# Patient Record
Sex: Male | Born: 1960 | Race: White | Hispanic: No
Health system: Southern US, Community
[De-identification: ages and names within clinical notes are randomized; demographics above are authoritative.]

---

## 2019-01-08 ENCOUNTER — Encounter (HOSPITAL_COMMUNITY): Payer: Self-pay | Admitting: Emergency Medicine

## 2019-01-08 ENCOUNTER — Emergency Department (HOSPITAL_COMMUNITY): Payer: 59

## 2019-01-08 ENCOUNTER — Emergency Department (HOSPITAL_COMMUNITY)
Admission: EM | Admit: 2019-01-08 | Discharge: 2019-01-08 | Disposition: A | Discharge status: Home / Self Care | Payer: 59 | Attending: Emergency Medicine | Admitting: Emergency Medicine

## 2019-01-08 ENCOUNTER — Other Ambulatory Visit: Payer: Self-pay

## 2019-01-08 DIAGNOSIS — R112 Nausea with vomiting, unspecified: Secondary | ICD-10-CM

## 2019-01-08 DIAGNOSIS — R079 Chest pain, unspecified: Secondary | ICD-10-CM | POA: Diagnosis present

## 2019-01-08 DIAGNOSIS — R0789 Other chest pain: Secondary | ICD-10-CM

## 2019-01-08 DIAGNOSIS — Z20828 Contact with and (suspected) exposure to other viral communicable diseases: Secondary | ICD-10-CM | POA: Insufficient documentation

## 2019-01-08 DIAGNOSIS — R200 Anesthesia of skin: Secondary | ICD-10-CM

## 2019-01-08 LAB — BASIC METABOLIC PANEL
Anion gap: 14 (ref 5–15)
BUN: 18 mg/dL (ref 6–20)
CO2: 24 mmol/L (ref 22–32)
Calcium: 9.4 mg/dL (ref 8.9–10.3)
Chloride: 99 mmol/L (ref 98–111)
Creatinine, Ser: 1.12 mg/dL (ref 0.61–1.24)
GFR calc Af Amer: >60 mL/min (ref >60)
GFR calc non Af Amer: >60 mL/min (ref >60)
Glucose, Bld: 124 mg/dL — ABNORMAL HIGH (ref 70–99)
Potassium: 3.2 mmol/L — ABNORMAL LOW (ref 3.5–5.1)
Sodium: 137 mmol/L (ref 135–145)

## 2019-01-08 LAB — CBC WITH DIFFERENTIAL/PLATELET
Abs Immature Granulocytes: 0.04 10*3/uL (ref 0.00–0.07)
Basophils Absolute: 0.1 10*3/uL (ref 0.0–0.1)
Basophils Relative: 1 %
Eosinophils Absolute: 0.1 10*3/uL (ref 0.0–0.5)
Eosinophils Relative: 1 %
HCT: 50.1 % (ref 39.0–52.0)
Hemoglobin: 17.3 g/dL — ABNORMAL HIGH (ref 13.0–17.0)
Immature Granulocytes: 0 %
Lymphocytes Relative: 17 %
Lymphs Abs: 1.8 10*3/uL (ref 0.7–4.0)
MCH: 32 pg (ref 26.0–34.0)
MCHC: 34.5 g/dL (ref 30.0–36.0)
MCV: 92.8 fL (ref 80.0–100.0)
Monocytes Absolute: 0.9 10*3/uL (ref 0.1–1.0)
Monocytes Relative: 8 %
Neutro Abs: 7.8 10*3/uL — ABNORMAL HIGH (ref 1.7–7.7)
Neutrophils Relative %: 73 %
Platelets: 196 10*3/uL (ref 150–400)
RBC: 5.4 MIL/uL (ref 4.22–5.81)
RDW: 13.6 % (ref 11.5–15.5)
WBC: 10.6 10*3/uL — ABNORMAL HIGH (ref 4.0–10.5)
nRBC: 0 % (ref 0.0–0.2)

## 2019-01-08 LAB — POCT I-STAT TROPONIN I: Troponin i, poc: 0 ng/mL (ref 0.00–0.08)

## 2019-01-08 LAB — TROPONIN I: Troponin I: <0.03 ng/mL (ref <0.03)

## 2019-01-08 LAB — D-DIMER, QUANTITATIVE: D-Dimer, Quant: 2.07 ug/mL-FEU — ABNORMAL HIGH (ref 0.00–0.50)

## 2019-01-08 LAB — ETHANOL: Alcohol, Ethyl (B): <10 mg/dL (ref <10)

## 2019-01-08 LAB — SARS CORONAVIRUS 2 BY RT PCR (HOSPITAL ORDER, PERFORMED IN ~~LOC~~ HOSPITAL LAB): SARS Coronavirus 2: NEGATIVE

## 2019-01-08 LAB — GLUCOSE, CAPILLARY: Glucose-Capillary: 141 mg/dL — ABNORMAL HIGH (ref 70–99)

## 2019-01-08 MED ORDER — SODIUM CHLORIDE (PF) 0.9 % IJ SOLN
INTRAMUSCULAR | Status: AC
  Filled 2019-01-08: qty 50

## 2019-01-08 MED ORDER — ONDANSETRON HCL 4 MG PO TABS: 4.0000 mg | ORAL_TABLET | Freq: Four times a day (QID) | ORAL | 0 refills | Status: AC

## 2019-01-08 MED ORDER — PANTOPRAZOLE SODIUM 40 MG IV SOLR
40.0000 mg | Freq: Once | INTRAVENOUS | Status: AC
  Administered 2019-01-08: 40 mg via INTRAVENOUS
  Filled 2019-01-08: qty 40

## 2019-01-08 MED ORDER — GLUCAGON HCL RDNA (DIAGNOSTIC) 1 MG IJ SOLR
1.0000 mg | Freq: Once | INTRAMUSCULAR | Status: AC
  Administered 2019-01-08: 1 mg via INTRAVENOUS
  Filled 2019-01-08: qty 1

## 2019-01-08 MED ORDER — IOHEXOL 350 MG/ML SOLN
100.0000 mL | Freq: Once | INTRAVENOUS | Status: AC | PRN
  Administered 2019-01-08: 100 mL via INTRAVENOUS

## 2019-01-08 MED ORDER — PROMETHAZINE HCL 25 MG/ML IJ SOLN
12.5000 mg | Freq: Once | INTRAMUSCULAR | Status: AC
  Administered 2019-01-08: 12.5 mg via INTRAVENOUS
  Filled 2019-01-08: qty 1

## 2019-01-08 MED ORDER — KETOROLAC TROMETHAMINE 15 MG/ML IJ SOLN
15.0000 mg | Freq: Once | INTRAMUSCULAR | Status: AC
  Administered 2019-01-08: 15 mg via INTRAVENOUS
  Filled 2019-01-08: qty 1

## 2019-01-08 MED ORDER — FAMOTIDINE 20 MG PO TABS: 20.0000 mg | ORAL_TABLET | Freq: Two times a day (BID) | ORAL | 0 refills | Status: AC

## 2019-01-08 MED ORDER — SODIUM CHLORIDE 0.9% FLUSH
3.0000 mL | Freq: Once | INTRAVENOUS | Status: AC
  Administered 2019-01-08: 3 mL via INTRAVENOUS

## 2019-01-08 NOTE — ED Notes (Signed)
BG=[id number]

## 2019-01-08 NOTE — ED Triage Notes (Signed)
Pt arriving POV with chest pain, N/V, and dizziness. Pt reports the pain started 3 days ago and is most prevalent in the mid-sternal area. Pt seen in [place] yesterday for same.

## 2019-01-08 NOTE — ED Provider Notes (Signed)
[facility] DEPT Provider Note: Lowella Dell, MD, FACEP  CSN: [id number] MRN: [id number] ARRIVAL: 01/08/19 at 0435 ROOM: WA16/[facility]16   CHIEF COMPLAINT  Chest Pain   HISTORY OF PRESENT ILLNESS  01/08/19 5:14 AM William Yates is a 58 y.o. male without significant past medical history.  He is here with a 3-day history of nausea, vomiting and chest pain.  He states these all began about the same time.  He describes the chest pain as sharp, located in his lower sternal area, and rates it as an 8 out of 10.  It is worse with palpation or movement.  He states he has not been able to swallow anything for the past 3 days because it feels like it gets stuck and then comes back up.  He also feels short of breath.  He has had a subjective fever and chills.  He was seen yesterday at [facility] in [place] and was tested for strep throat (because he has had a scratchy throat) which was negative, and a respiratory viral panel which was negative.  This respiratory viral panel included common coronavirus is but did not include the COVID-19 pathogen.  He was diagnosed with a viral illness and discharged.  He states that for several hours now his forehead has become numb bilaterally and his left arm has become numb.  He denies motor weakness.  He states that in triage he hallucinated about an elevator which was not there.  He denies eating steak or anything else that may have gotten stuck in his esophagus and does not believe this is possible because he was vomiting freely at the beginning of his illness.   History reviewed. No pertinent past medical history.  History reviewed. No pertinent surgical history.  No family history on file.  Social History   Tobacco Use  . Smoking status: Not on file  Substance Use Topics  . Alcohol use: Not on file  . Drug use: Not on file    Prior to Admission medications   Not on File    Allergies Patient has no known allergies.   REVIEW OF SYSTEMS   Negative except as noted here or in the History of Present Illness.   PHYSICAL EXAMINATION  Initial Vital Signs Blood pressure (!) 142/109, pulse 99, temperature 98.5 F (36.9 C), temperature source Oral, resp. rate 16, height 6\' 2"  (1.88 m), weight 99.8 kg, SpO2 100 %.  Examination General: Well-developed, well-nourished male in no acute distress; appearance consistent with age of record HENT: normocephalic; atraumatic Eyes: pupils equal, round and reactive to light; extraocular muscles intact Neck: supple Heart: regular rate and rhythm Lungs: clear to auscultation bilaterally Chest: Lower sternal tenderness which reproduces the pain of the chief complaint Abdomen: soft; nondistended; nontender; bowel sounds present Extremities: No deformity; full range of motion; pulses normal Neurologic: Awake, alert and oriented; motor function intact in all extremities and symmetric; no facial droop; decreased sensation of forehead bilaterally and left upper extremity; negative Romberg; normal coordination and speech Skin: Warm and dry Psychiatric: Normal mood and affect   RESULTS  Summary of this visit's results, reviewed by myself:   EKG Interpretation  Date/Time:  Wednesday Jan 08 2019 04:51:39 EDT Ventricular Rate:  117 PR Interval:    QRS Duration: 78 QT Interval:  366 QTC Calculation: 511 R Axis:   -19 Text Interpretation:  Sinus tachycardia Borderline left axis deviation Prolonged QT interval No previous ECGs available Confirmed by Stevi Hollinshead ([id number]) on 01/08/2019 4:56:11 AM Also confirmed by  Trenten Watchman ([id number]), editor Barbette Hair [phone])  on 01/08/2019 6:57:21 AM      Laboratory Studies: Results for orders placed or performed during the hospital encounter of 01/08/19 (from the past 24 hour(s))  Basic metabolic panel     Status: Abnormal   Collection Time: 01/08/19  5:01 AM  Result Value Ref Range   Sodium 137 135 - 145 mmol/L   Potassium 3.2 (L) 3.5 - 5.1 mmol/L    Chloride 99 98 - 111 mmol/L   CO2 24 22 - 32 mmol/L   Glucose, Bld 124 (H) 70 - 99 mg/dL   BUN 18 6 - 20 mg/dL   Creatinine, Ser 0.98 0.61 - 1.24 mg/dL   Calcium 9.4 8.9 - 11.9 mg/dL   GFR calc non Af Amer >60 >60 mL/min   GFR calc Af Amer >60 >60 mL/min   Anion gap 14 5 - 15  Troponin I - ONCE - STAT     Status: None   Collection Time: 01/08/19  5:01 AM  Result Value Ref Range   Troponin I <0.03 <0.03 ng/mL  CBC with Differential/Platelet     Status: Abnormal   Collection Time: 01/08/19  5:08 AM  Result Value Ref Range   WBC 10.6 (H) 4.0 - 10.5 K/uL   RBC 5.40 4.22 - 5.81 MIL/uL   Hemoglobin 17.3 (H) 13.0 - 17.0 g/dL   HCT 14.7 82.9 - 56.2 %   MCV 92.8 80.0 - 100.0 fL   MCH 32.0 26.0 - 34.0 pg   MCHC 34.5 30.0 - 36.0 g/dL   RDW 13.0 86.5 - 78.4 %   Platelets 196 150 - 400 K/uL   nRBC 0.0 0.0 - 0.2 %   Neutrophils Relative % 73 %   Neutro Abs 7.8 (H) 1.7 - 7.7 K/uL   Lymphocytes Relative 17 %   Lymphs Abs 1.8 0.7 - 4.0 K/uL   Monocytes Relative 8 %   Monocytes Absolute 0.9 0.1 - 1.0 K/uL   Eosinophils Relative 1 %   Eosinophils Absolute 0.1 0.0 - 0.5 K/uL   Basophils Relative 1 %   Basophils Absolute 0.1 0.0 - 0.1 K/uL   Immature Granulocytes 0 %   Abs Immature Granulocytes 0.04 0.00 - 0.07 K/uL  D-dimer, quantitative (not at [facility])     Status: Abnormal   Collection Time: 01/08/19  5:08 AM  Result Value Ref Range   D-Dimer, Quant 2.07 (H) 0.00 - 0.50 ug/mL-FEU  Ethanol     Status: None   Collection Time: 01/08/19  5:08 AM  Result Value Ref Range   Alcohol, Ethyl (B) <10 <10 mg/dL  Glucose, capillary     Status: Abnormal   Collection Time: 01/08/19  5:25 AM  Result Value Ref Range   Glucose-Capillary 141 (H) 70 - 99 mg/dL   Imaging Studies: Dg Chest 2 View  Result Date: 01/08/2019 CLINICAL DATA:  Chest pain for 24 hours EXAM: CHEST - 2 VIEW COMPARISON:  None. FINDINGS: The heart size and mediastinal contours are within normal limits. Both lungs are clear. The  visualized skeletal structures are unremarkable. IMPRESSION: Negative chest. Electronically Signed   By: Marnee Spring M.D.   On: 01/08/2019 05:35    ED COURSE and MDM  Nursing notes and initial vitals signs, including pulse oximetry, reviewed.  Vitals:   01/08/19 0535 01/08/19 0540 01/08/19 0550 01/08/19 0600  BP: (!) 159/114 (!) 148/112 (!) 151/119 (!) 140/108  Pulse: (!) 103 96 98 93  Resp: (!) 22 17  20 14  Temp:      TempSrc:      SpO2: 99% 93% 97% 95%  Weight:      Height:       7:08 AM Awaiting results of CT scan of head and chest.  Dr. Rodena MedinMessick to follow-up on results and make disposition.  PROCEDURES    ED DIAGNOSES     ICD-10-CM   1. Atypical chest pain R07.89   2. Nausea and vomiting in adult R11.2   3. Numbness R20.0        Charlita Brian, Jonny RuizJohn, MD 01/08/19 [phone]

## 2019-01-08 NOTE — Discharge Instructions (Signed)
Please return for any problem. Follow up with a regular provider as instructed.   Your COVID test was negative today.   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 This does not require emergent treatment now. This should be re-imaged on annual basis for monitoring. A regular care provider can manage this.

## 2019-01-08 NOTE — ED Notes (Addendum)
Pt said, has chest pain and vomiting for 3 days. Tingling down left arm. Hallucinated in lobby that he was in elevator and sitting in chair. Didn't feel tapping on left side of face. Head is numb. Feels neck.

## 2019-01-08 NOTE — ED Notes (Signed)
ED Provider at bedside. 

## 2019-01-08 NOTE — ED Provider Notes (Signed)
Patient seen after sign out from prior ED provider.   Patient without evidence of significant acute pathology.  EKG does not demonstrate acute ischemia. Trop times 2 is negative. Reported symptoms are not consistent with ACS.   CTA did not demonstrate PE or other acute pathology. Patient is aware of aorta dilation found and CT and need for close follow up.   Patient does feel improved and now desires DC.   Strict return precautions given and understood.   Importance of close follow up is repeatedly stressed.    Wynetta Fines, MD 01/08/19 1000

## 2019-09-22 DEATH — deceased

## 2021-01-12 IMAGING — CR CHEST - 2 VIEW
2 series · 2 of 2 positions shown · non-contrast
Comparison: None.

CLINICAL DATA: Chest pain for 24 hours

EXAM:
CHEST - 2 VIEW

[w chest pa]
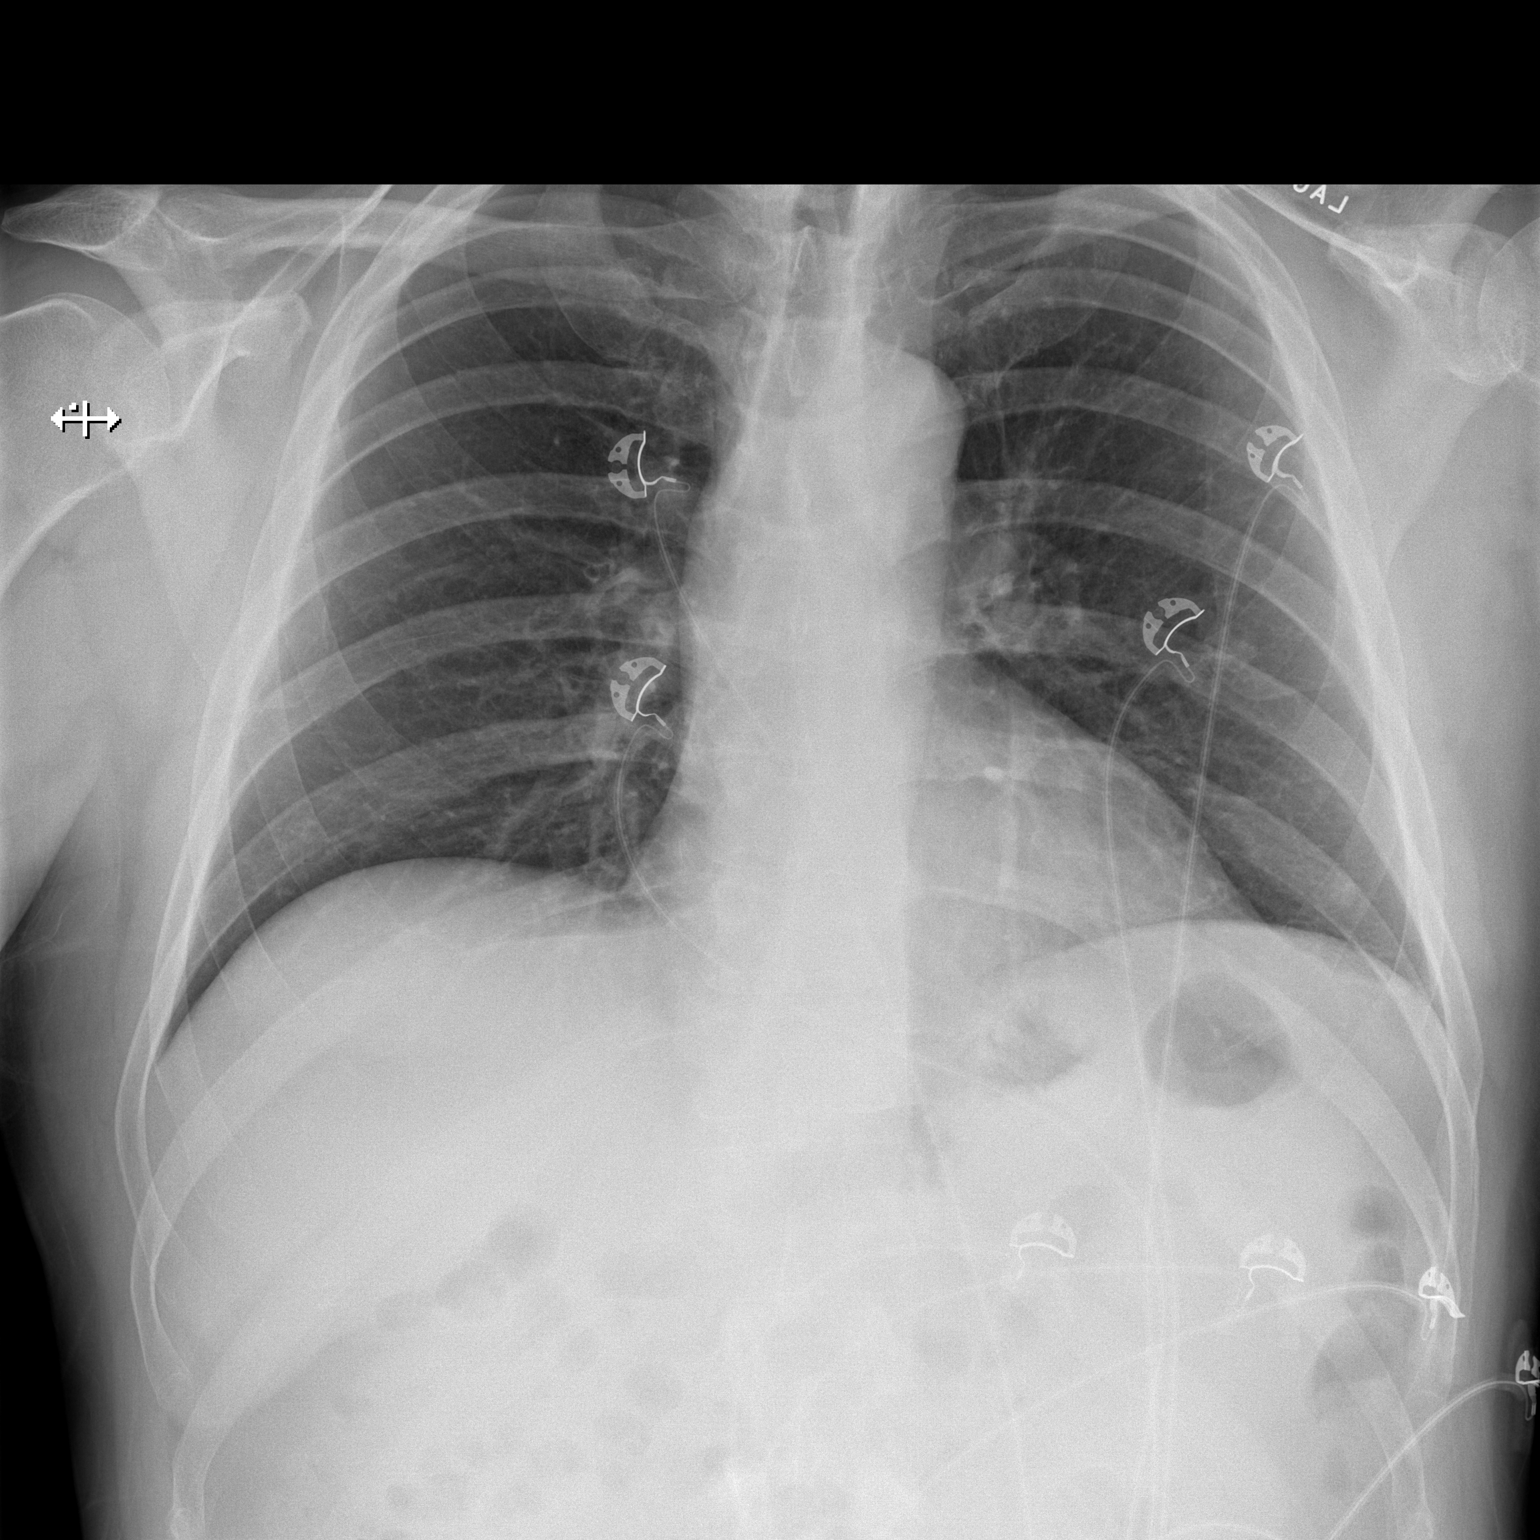

[w chest lat]
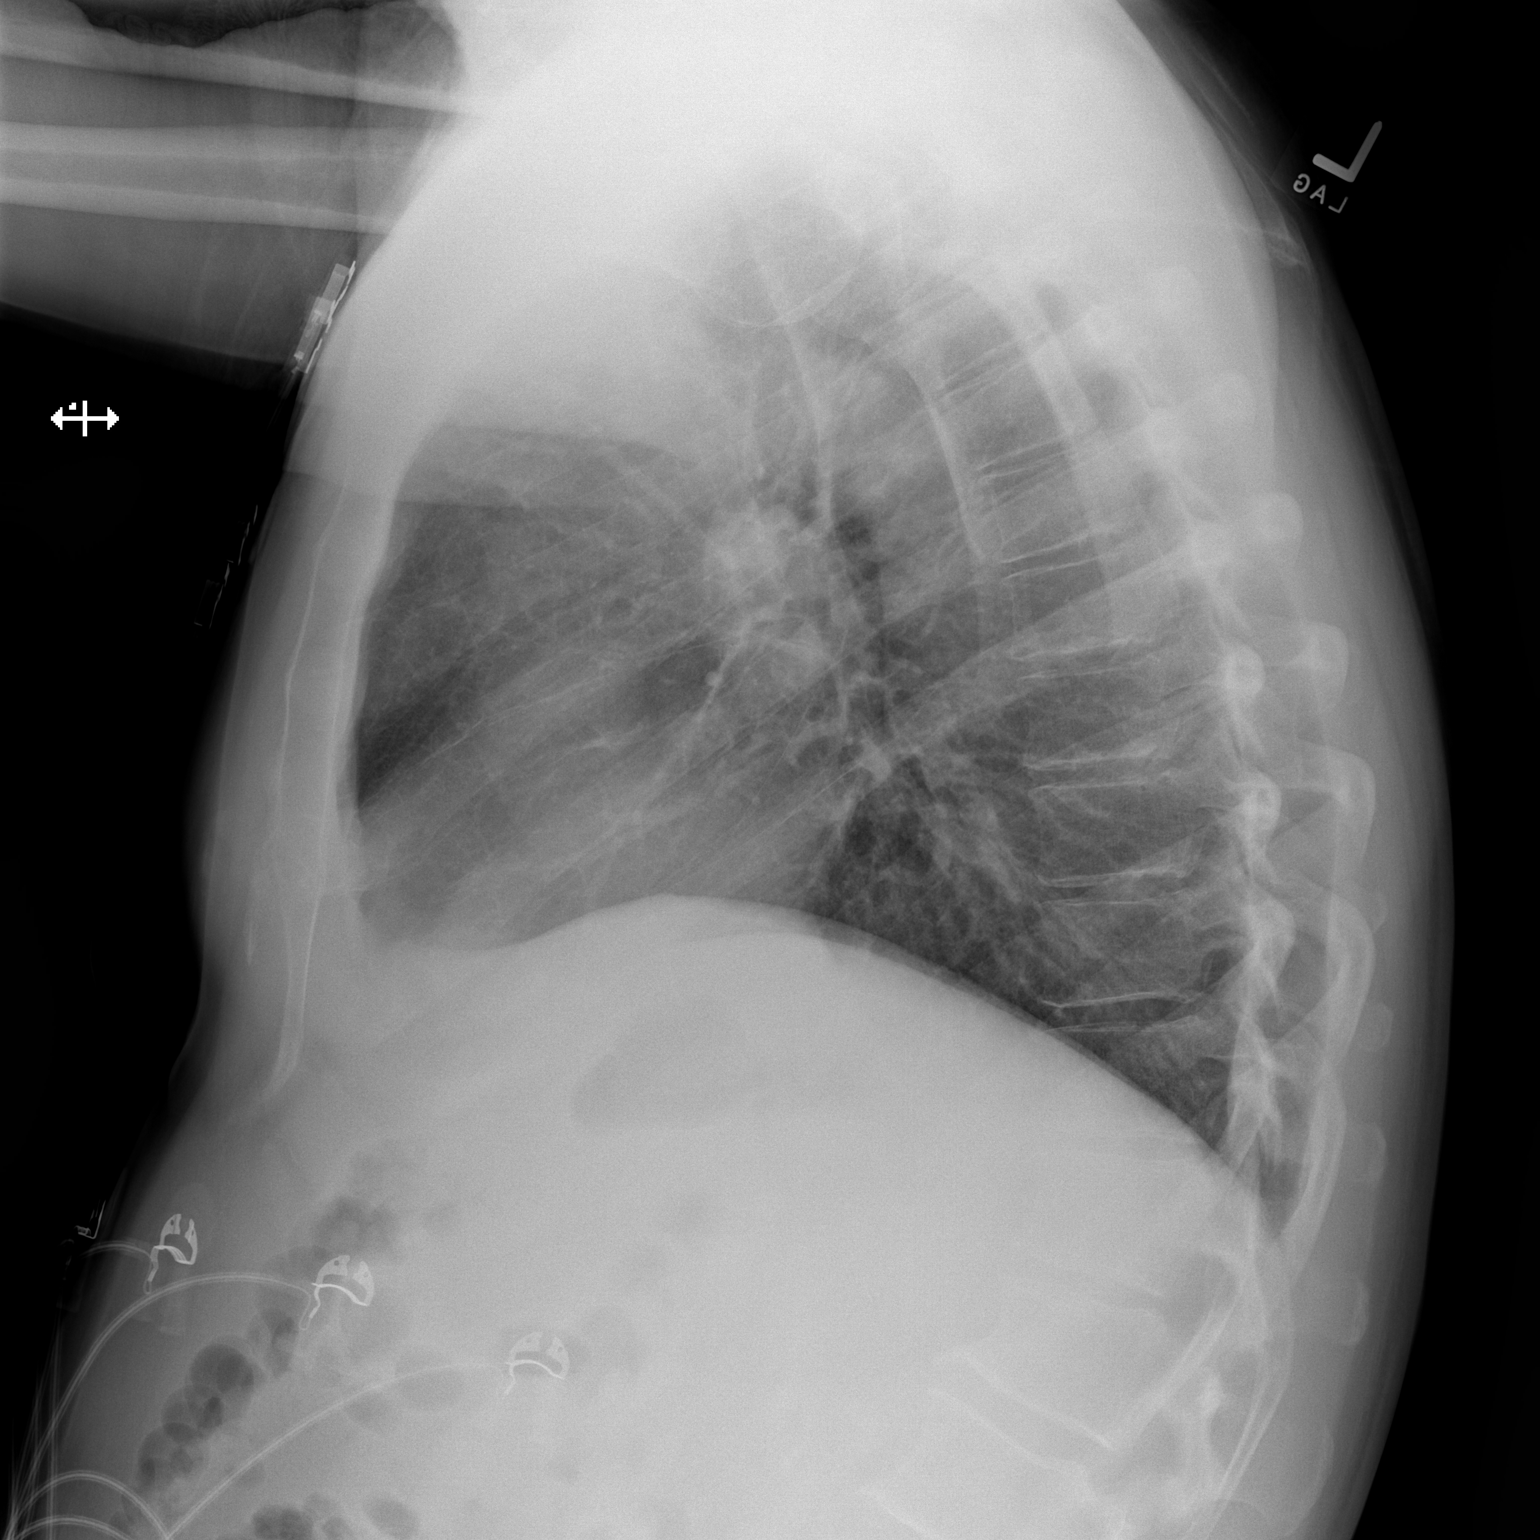

[2 of 2 positions shown; findings below may reference images not displayed]

FINDINGS: The heart size and mediastinal contours are within normal limits.
Both lungs are clear. The visualized skeletal structures are
unremarkable.
IMPRESSION: Negative chest.

## 2021-01-12 IMAGING — CT CT HEAD WITHOUT CONTRAST
3 series · 16 of 47 positions shown, 19 images · non-contrast
Comparison: None.

CLINICAL DATA: Numbness to LEFT head and face. Tingling down LEFT
arm. Hallucinations. Elevated blood sugar.

EXAM:
CT HEAD WITHOUT CONTRAST
TECHNIQUE: Contiguous axial images were obtained from the base of the skull
through the vertex without intravenous contrast.

[Series 2: head wo · axial · 0.47mm/px · z∈[-96,+39]mm · 10 of 33 slices shown, 13 images]
[im 3/33  brain]
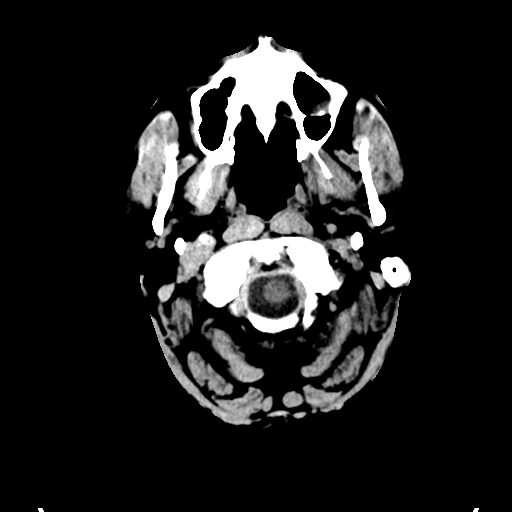
[im 3/33  bone]
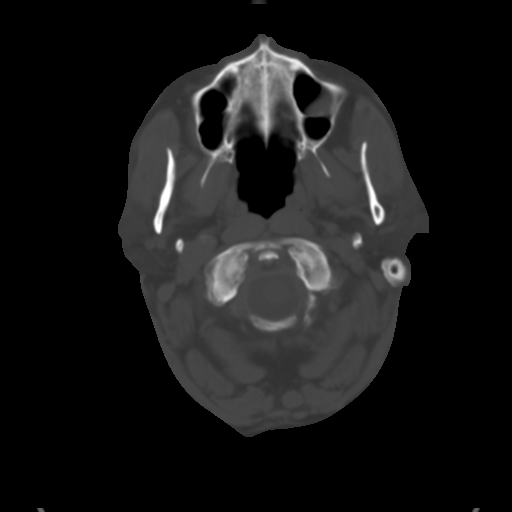
[im 6/33  brain]
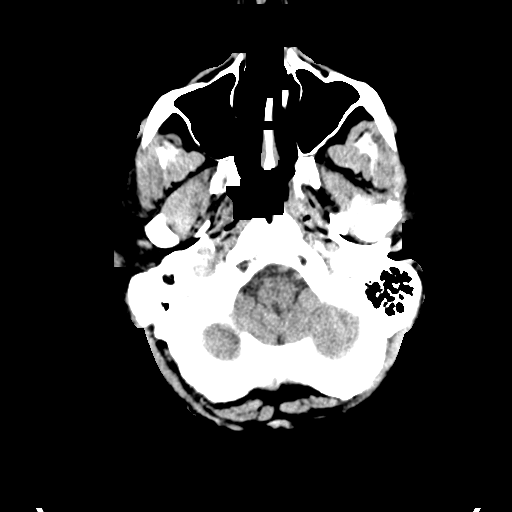
[im 9/33  brain]
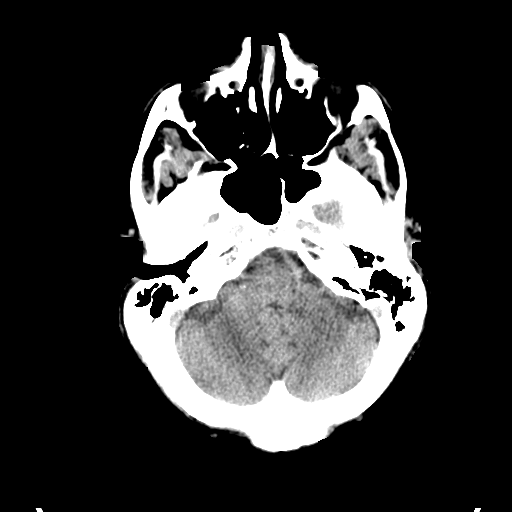
[im 12/33  brain]
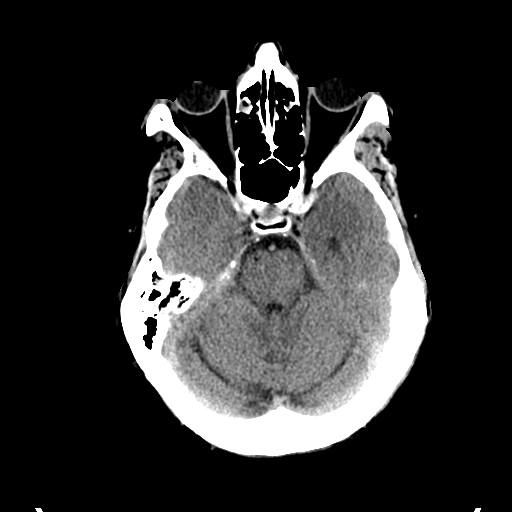
[im 15/33  brain]
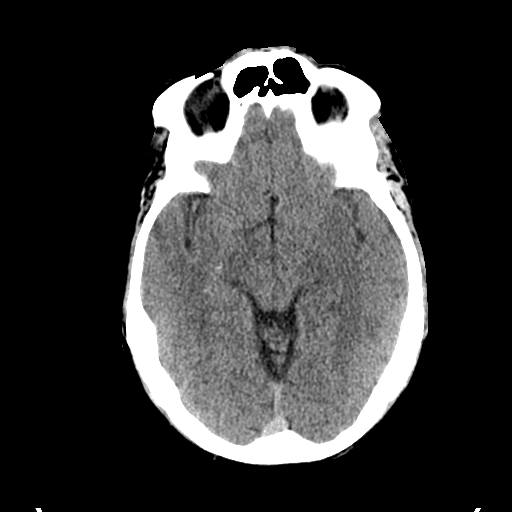
[im 15/33  bone]
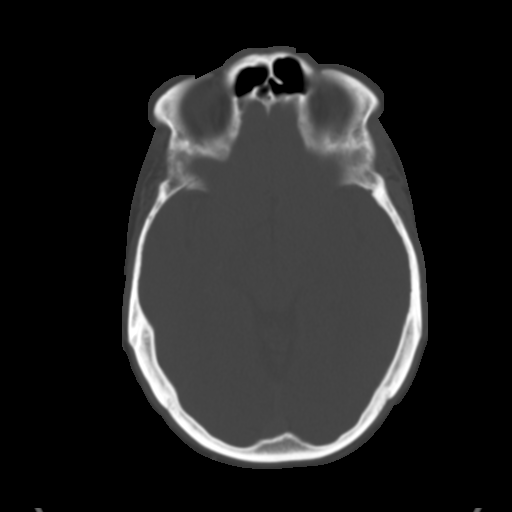
[im 18/33  brain]
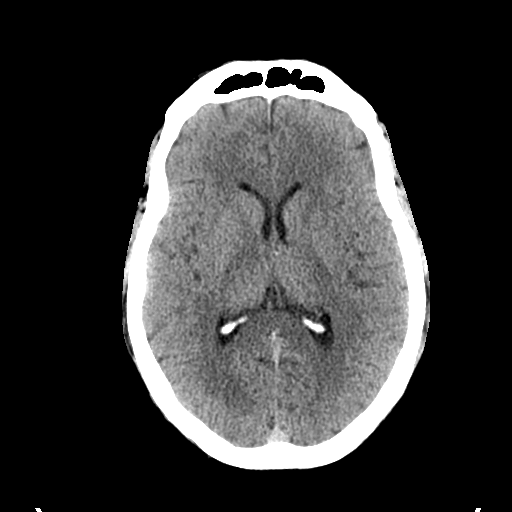
[im 21/33  brain]
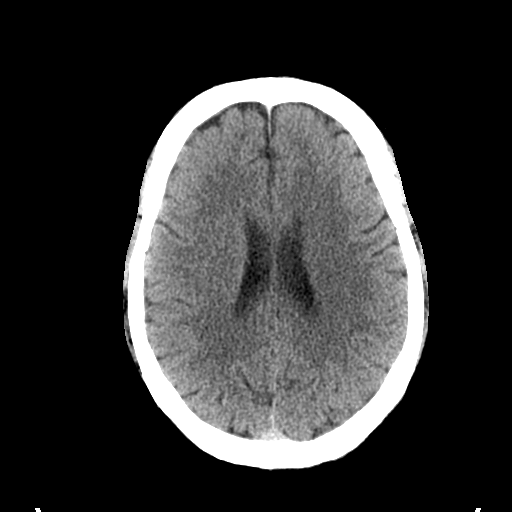
[im 25/33  brain]
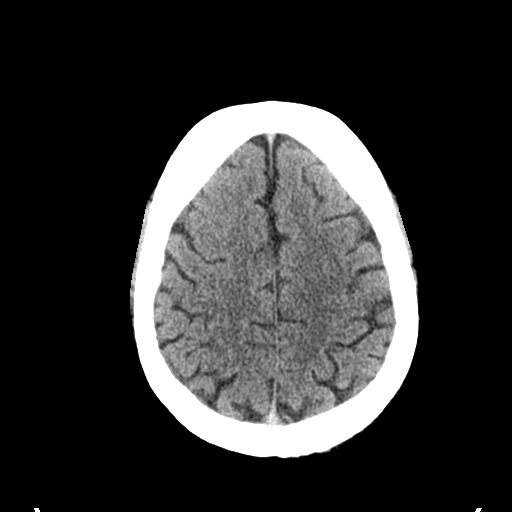
[im 27/33  brain]
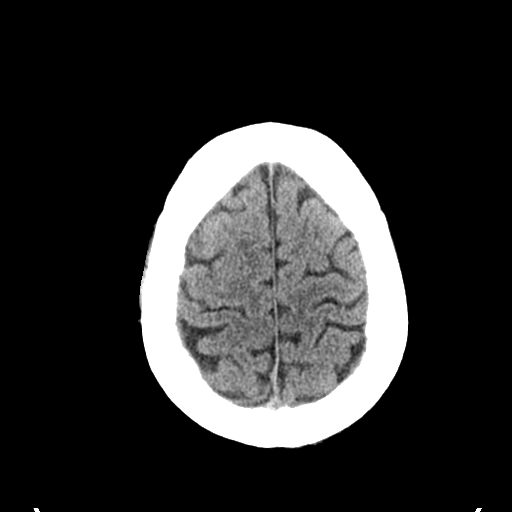
[im 27/33  bone]
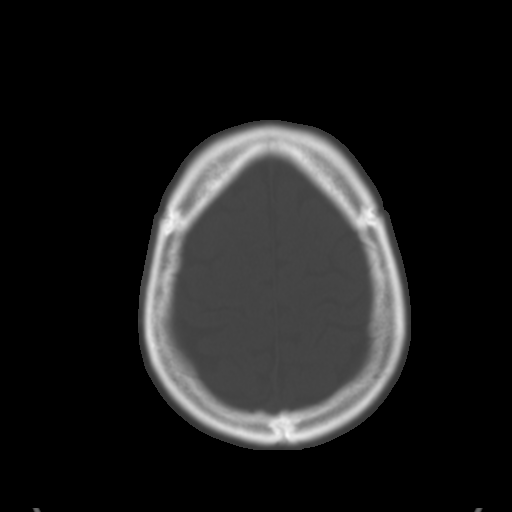
[im 30/33  brain]
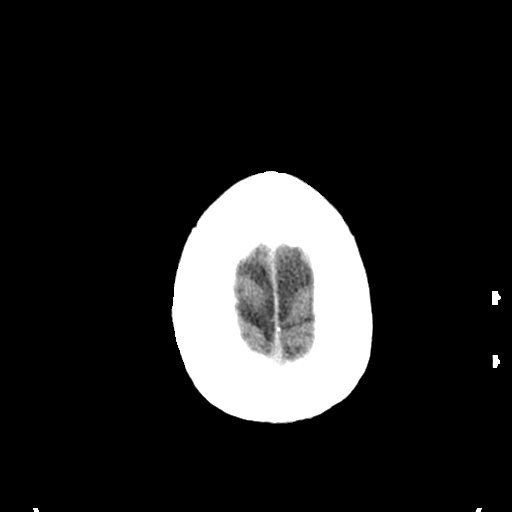

[Series 4: coronal soft tissue · coronal · 0.30mm/px · 3 of 69 slices shown]
[im 23/69  brain]
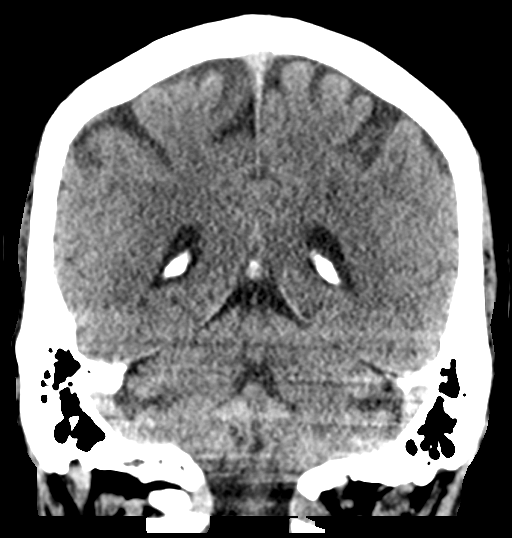
[im 31/69  brain]
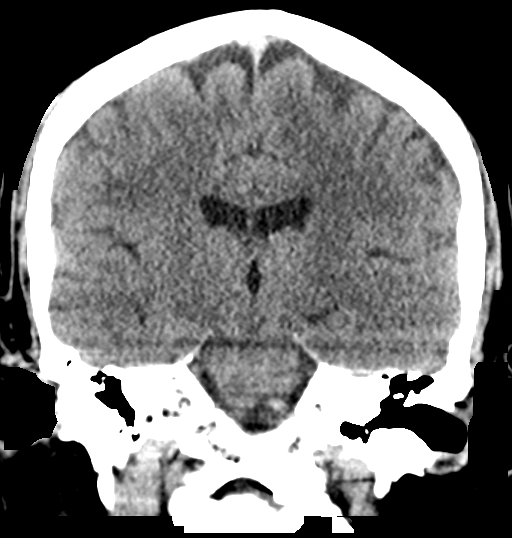
[im 38/69  brain]
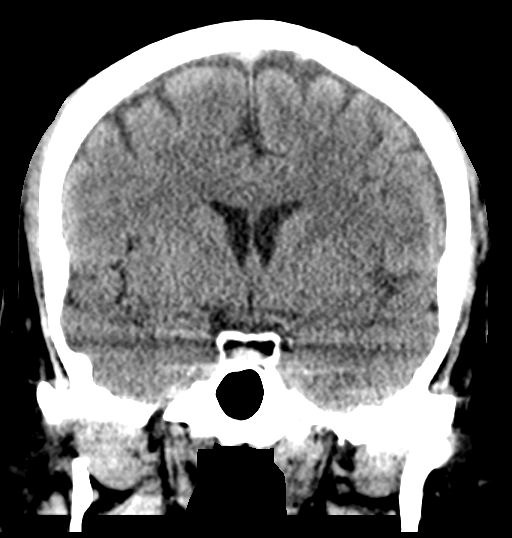

[Series 5: sagittal soft tissue · sagittal · 0.32mm/px · 3 of 50 slices shown]
[im 17/50  brain]
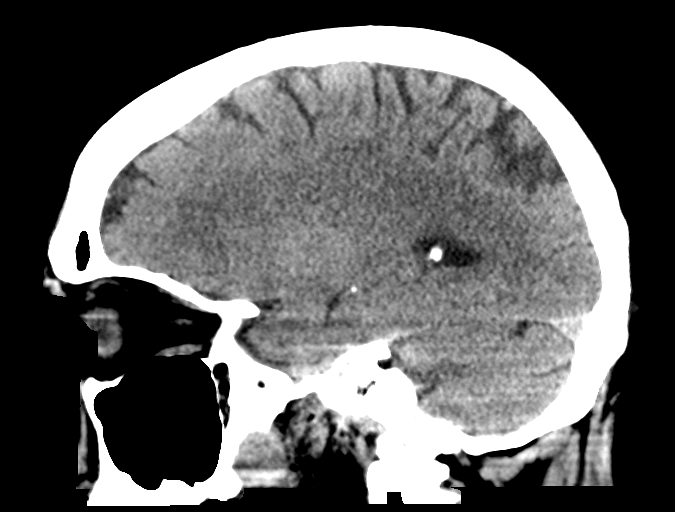
[im 25/50  brain]
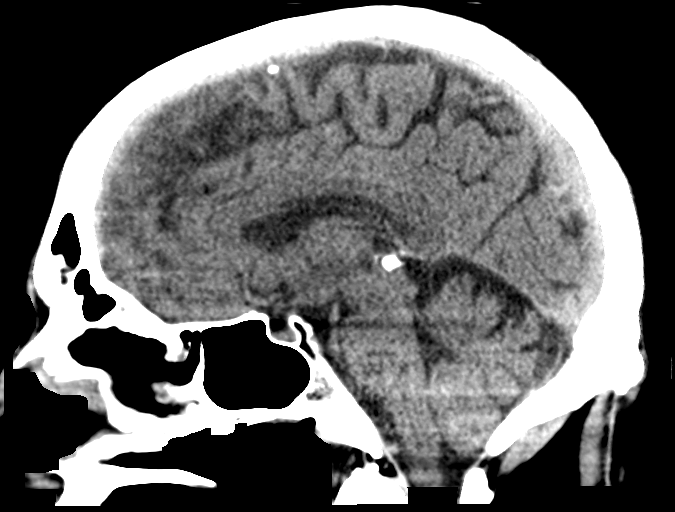
[im 33/50  brain]
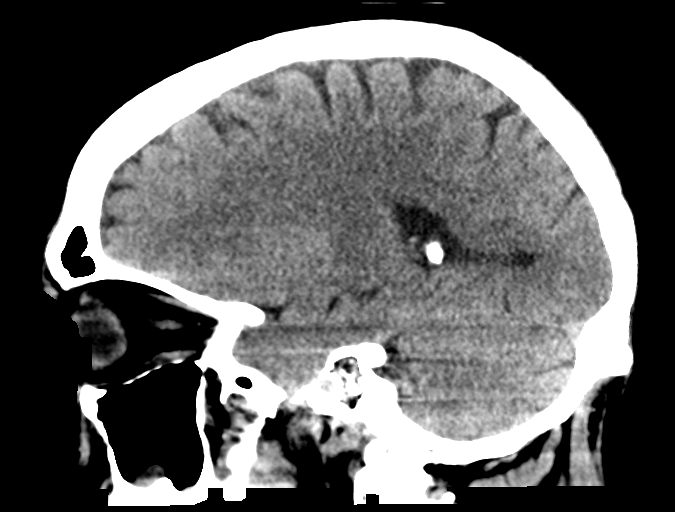

[16 of 47 positions shown; findings below may reference images not displayed]

FINDINGS: Brain: No evidence for acute infarction, hemorrhage, mass lesion,
hydrocephalus, or extra-axial fluid. Normal cerebral volume. No
white matter disease.

Vascular: No hyperdense vessel or unexpected calcification.

Skull: Normal. Negative for fracture or focal lesion.

Sinuses/Orbits: Minor mucosal thickening.  Unremarkable orbits.

Other: None.
IMPRESSION: Negative exam.
